# Patient Record
Sex: Male | Born: 1990 | Race: White | Hispanic: No | Smoking: Current every day smoker
Health system: Southern US, Community
[De-identification: ages and names within clinical notes are randomized; demographics above are authoritative.]

---
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
## 2012-09-15 ENCOUNTER — Emergency Department: Payer: Self-pay | Admitting: Emergency Medicine

## 2012-10-22 ENCOUNTER — Emergency Department: Payer: Self-pay | Admitting: Emergency Medicine

## 2014-05-06 ENCOUNTER — Emergency Department: Payer: Self-pay | Admitting: Emergency Medicine

## 2014-05-09 ENCOUNTER — Emergency Department: Payer: Self-pay | Admitting: Emergency Medicine

## 2014-05-10 ENCOUNTER — Emergency Department: Payer: Self-pay | Admitting: Emergency Medicine

## 2014-05-11 LAB — BASIC METABOLIC PANEL
Anion Gap: 5 — ABNORMAL LOW (ref 7–16)
BUN: 10 mg/dL (ref 7–18)
CALCIUM: 9.3 mg/dL (ref 8.5–10.1)
Chloride: 107 mmol/L (ref 98–107)
Co2: 30 mmol/L (ref 21–32)
Creatinine: 0.88 mg/dL (ref 0.60–1.30)
EGFR (African American): >60
EGFR (Non-African Amer.): >60
Glucose: 108 mg/dL — ABNORMAL HIGH (ref 65–99)
Osmolality: 283 (ref 275–301)
POTASSIUM: 3.3 mmol/L — AB (ref 3.5–5.1)
Sodium: 142 mmol/L (ref 136–145)

## 2014-05-11 LAB — CBC WITH DIFFERENTIAL/PLATELET
BASOS PCT: 0.6 %
Basophil #: 0.1 10*3/uL (ref 0.0–0.1)
EOS ABS: 0 10*3/uL (ref 0.0–0.7)
EOS PCT: 0.1 %
HCT: 46.4 % (ref 40.0–52.0)
HGB: 15.6 g/dL (ref 13.0–18.0)
LYMPHS PCT: 13.5 %
Lymphocyte #: 1.5 10*3/uL (ref 1.0–3.6)
MCH: 30.8 pg (ref 26.0–34.0)
MCHC: 33.5 g/dL (ref 32.0–36.0)
MCV: 92 fL (ref 80–100)
MONO ABS: 1.2 x10 3/mm — AB (ref 0.2–1.0)
Monocyte %: 10.5 %
NEUTROS ABS: 8.6 10*3/uL — AB (ref 1.4–6.5)
NEUTROS PCT: 75.3 %
Platelet: 164 10*3/uL (ref 150–440)
RBC: 5.06 10*6/uL (ref 4.40–5.90)
RDW: 13.3 % (ref 11.5–14.5)
WBC: 11.4 10*3/uL — ABNORMAL HIGH (ref 3.8–10.6)

## 2014-05-16 LAB — CULTURE, BLOOD (SINGLE)

## 2014-06-01 ENCOUNTER — Emergency Department: Payer: Self-pay | Admitting: Emergency Medicine

## 2019-07-05 ENCOUNTER — Other Ambulatory Visit: Payer: Self-pay

## 2019-07-05 ENCOUNTER — Emergency Department: Payer: Self-pay

## 2019-07-05 ENCOUNTER — Emergency Department
Admission: EM | Admit: 2019-07-05 | Discharge: 2019-07-06 | Disposition: A | Discharge status: Home / Self Care | Payer: Self-pay | Attending: Emergency Medicine | Admitting: Emergency Medicine

## 2019-07-05 DIAGNOSIS — R05 Cough: Secondary | ICD-10-CM | POA: Insufficient documentation

## 2019-07-05 DIAGNOSIS — R112 Nausea with vomiting, unspecified: Secondary | ICD-10-CM | POA: Insufficient documentation

## 2019-07-05 DIAGNOSIS — M7918 Myalgia, other site: Secondary | ICD-10-CM | POA: Insufficient documentation

## 2019-07-05 DIAGNOSIS — Z2883 Immunization not carried out due to unavailability of vaccine: Secondary | ICD-10-CM | POA: Insufficient documentation

## 2019-07-05 DIAGNOSIS — F17200 Nicotine dependence, unspecified, uncomplicated: Secondary | ICD-10-CM | POA: Insufficient documentation

## 2019-07-05 DIAGNOSIS — Z20828 Contact with and (suspected) exposure to other viral communicable diseases: Secondary | ICD-10-CM | POA: Insufficient documentation

## 2019-07-05 DIAGNOSIS — K529 Noninfective gastroenteritis and colitis, unspecified: Secondary | ICD-10-CM | POA: Insufficient documentation

## 2019-07-05 LAB — COMPREHENSIVE METABOLIC PANEL
ALT: 13 U/L (ref 0–44)
AST: 15 U/L (ref 15–41)
Albumin: 4 g/dL (ref 3.5–5.0)
Alkaline Phosphatase: 39 U/L (ref 38–126)
Anion gap: 9 (ref 5–15)
BUN: 11 mg/dL (ref 6–20)
CO2: 25 mmol/L (ref 22–32)
Calcium: 9 mg/dL (ref 8.9–10.3)
Chloride: 106 mmol/L (ref 98–111)
Creatinine, Ser: 0.93 mg/dL (ref 0.61–1.24)
GFR calc Af Amer: >60 mL/min (ref >60)
GFR calc non Af Amer: >60 mL/min (ref >60)
Glucose, Bld: 112 mg/dL — ABNORMAL HIGH (ref 70–99)
Potassium: 3.4 mmol/L — ABNORMAL LOW (ref 3.5–5.1)
Sodium: 140 mmol/L (ref 135–145)
Total Bilirubin: 0.8 mg/dL (ref 0.3–1.2)
Total Protein: 6.3 g/dL — ABNORMAL LOW (ref 6.5–8.1)

## 2019-07-05 LAB — CBC
HCT: 46.3 % (ref 39.0–52.0)
Hemoglobin: 16.5 g/dL (ref 13.0–17.0)
MCH: 32.3 pg (ref 26.0–34.0)
MCHC: 35.6 g/dL (ref 30.0–36.0)
MCV: 90.6 fL (ref 80.0–100.0)
Platelets: 199 10*3/uL (ref 150–400)
RBC: 5.11 MIL/uL (ref 4.22–5.81)
RDW: 11.9 % (ref 11.5–15.5)
WBC: 6.1 10*3/uL (ref 4.0–10.5)
nRBC: 0 % (ref 0.0–0.2)

## 2019-07-05 LAB — URINALYSIS, COMPLETE (UACMP) WITH MICROSCOPIC
Bacteria, UA: NONE SEEN
Bilirubin Urine: NEGATIVE
Glucose, UA: NEGATIVE mg/dL
Hgb urine dipstick: NEGATIVE
Ketones, ur: NEGATIVE mg/dL
Leukocytes,Ua: NEGATIVE
Nitrite: NEGATIVE
Protein, ur: NEGATIVE mg/dL
Specific Gravity, Urine: 1.02 (ref 1.005–1.030)
pH: 5 (ref 5.0–8.0)

## 2019-07-05 LAB — LIPASE, BLOOD: Lipase: 22 U/L (ref 11–51)

## 2019-07-05 MED ORDER — CIPROFLOXACIN HCL 500 MG PO TABS
500.0000 mg | ORAL_TABLET | Freq: Once | ORAL | Status: AC
  Administered 2019-07-05: 500 mg via ORAL
  Filled 2019-07-05: qty 1

## 2019-07-05 MED ORDER — METRONIDAZOLE 500 MG PO TABS: 500.0000 mg | ORAL_TABLET | Freq: Two times a day (BID) | ORAL | 0 refills | Status: DC

## 2019-07-05 MED ORDER — HYDROCODONE-ACETAMINOPHEN 5-325 MG PO TABS: 1.0000 | ORAL_TABLET | Freq: Four times a day (QID) | ORAL | 0 refills | Status: DC | PRN

## 2019-07-05 MED ORDER — CIPROFLOXACIN HCL 500 MG PO TABS: 500.0000 mg | ORAL_TABLET | Freq: Two times a day (BID) | ORAL | 0 refills | Status: AC

## 2019-07-05 MED ORDER — IOHEXOL 300 MG/ML  SOLN
100.0000 mL | Freq: Once | INTRAMUSCULAR | Status: AC | PRN
  Administered 2019-07-05: 100 mL via INTRAVENOUS

## 2019-07-05 MED ORDER — ONDANSETRON 4 MG PO TBDP
4.0000 mg | ORAL_TABLET | Freq: Once | ORAL | Status: AC
  Administered 2019-07-05: 4 mg via ORAL
  Filled 2019-07-05: qty 1

## 2019-07-05 MED ORDER — HYDROCODONE-ACETAMINOPHEN 5-325 MG PO TABS
2.0000 | ORAL_TABLET | Freq: Once | ORAL | Status: AC
  Administered 2019-07-05: 2 via ORAL
  Filled 2019-07-05: qty 2

## 2019-07-05 MED ORDER — MORPHINE SULFATE (PF) 4 MG/ML IV SOLN
4.0000 mg | Freq: Once | INTRAVENOUS | Status: AC
  Administered 2019-07-05: 20:00:00 4 mg via INTRAVENOUS
  Filled 2019-07-05: qty 1

## 2019-07-05 MED ORDER — ONDANSETRON HCL 4 MG/2ML IJ SOLN
4.0000 mg | Freq: Once | INTRAMUSCULAR | Status: AC
  Administered 2019-07-05: 4 mg via INTRAVENOUS
  Filled 2019-07-05: qty 2

## 2019-07-05 MED ORDER — ONDANSETRON 4 MG PO TBDP: 4.0000 mg | ORAL_TABLET | Freq: Four times a day (QID) | ORAL | 0 refills | Status: DC | PRN

## 2019-07-05 MED ORDER — METRONIDAZOLE 500 MG PO TABS
500.0000 mg | ORAL_TABLET | Freq: Once | ORAL | Status: AC
  Administered 2019-07-05: 500 mg via ORAL
  Filled 2019-07-05: qty 1

## 2019-07-05 MED ORDER — SODIUM CHLORIDE 0.9 % IV BOLUS
1000.0000 mL | Freq: Once | INTRAVENOUS | Status: AC
  Administered 2019-07-05: 1000 mL via INTRAVENOUS

## 2019-07-05 MED ORDER — IOHEXOL 240 MG/ML SOLN
50.0000 mL | Freq: Once | INTRAMUSCULAR | Status: AC
  Administered 2019-07-05: 50 mL via ORAL

## 2019-07-05 NOTE — ED Triage Notes (Signed)
Pt presents c/o abd pain x2 days with N/V/D. Reports no emesis since yesterday but pain has gotten worse.

## 2019-07-05 NOTE — ED Notes (Signed)
ED Provider at bedside. 

## 2019-07-05 NOTE — ED Provider Notes (Signed)
[facility] Emergency Department Provider Note ____________________________________________   First MD Initiated Contact with Patient 07/05/19 1924     (approximate)  I have reviewed the triage vital signs and the nursing notes.  HISTORY  Chief Complaint Abdominal Pain  HPI Danny Austin is a 28 y.o. male here for evaluation of abdominal pain  Patient reports that last couple days he had a few loose stool, vomited a couple times but has not vomited since noon yesterday.  Said mild nausea.  Decreased appetite.  Went to work today and they said he had a fever, they sent him home and advised him to seek medical care.  Reports his nausea and diarrhea is actually getting better but he is having left-sided and left upper abdominal pain.  He also reports he had a cough and some slight achiness for the last 2 weeks as well.  Dry cough no shortness of breath.  No headache.  No history of medical illness other than he had his tonsils taken out years ago   History reviewed. No pertinent past medical history.  There are no active problems to display for this patient.   History reviewed. No pertinent surgical history.  Prior to Admission medications   Medication Sig Start Date End Date Taking? Authorizing Provider  ciprofloxacin (CIPRO) 500 MG tablet Take 1 tablet (500 mg total) by mouth 2 (two) times daily for 5 days. 07/05/19 07/10/19  Sharyn CreamerQuale, Windi Toro, MD  HYDROcodone-acetaminophen (NORCO/VICODIN) 5-325 MG tablet Take 1-2 tablets by mouth every 6 (six) hours as needed for moderate pain. 07/05/19   Sharyn CreamerQuale, Omaree Fuqua, MD  metroNIDAZOLE (FLAGYL) 500 MG tablet Take 1 tablet (500 mg total) by mouth 2 (two) times daily. 07/05/19   Sharyn CreamerQuale, Hondo Nanda, MD  ondansetron (ZOFRAN ODT) 4 MG disintegrating tablet Take 1 tablet (4 mg total) by mouth every 6 (six) hours as needed for nausea or vomiting. 07/05/19   Sharyn CreamerQuale, Elisabet Gutzmer, MD    Allergies Penicillins  Patient reports allergic to penicillins  History  reviewed. No pertinent family history.  Social History Social History   Tobacco Use  . Smoking status: Current Every Day Smoker  . Smokeless tobacco: Never Used  Substance Use Topics  . Alcohol use: Not on file  . Drug use: Not on file  Does not drink.  Does smoke.  Review of Systems Constitutional: See HPI. Eyes: No visual changes. ENT: No sore throat. Cardiovascular: Denies chest pain. Respiratory: Denies shortness of breath. Gastrointestinal: See HPI Genitourinary: Negative for dysuria. Musculoskeletal: Negative for back pain. Skin: Negative for rash. Neurological: Negative for headaches, areas of focal weakness or numbness.    ____________________________________________   PHYSICAL EXAM:  VITAL SIGNS: ED Triage Vitals  Enc Vitals Group     BP 07/05/19 1545 (!) 148/78     Pulse Rate 07/05/19 1545 68     Resp 07/05/19 1545 18     Temp 07/05/19 1545 98.7 F (37.1 C)     Temp src --      SpO2 07/05/19 1545 97 %     Weight 07/05/19 1546 175 lb (79.4 kg)     Height 07/05/19 1546 5\' 10"  (1.778 m)     Head Circumference --      Peak Flow --      Pain Score 07/05/19 1553 0     Pain Loc --      Pain Edu? --      Excl. in GC? --     Constitutional: Alert and oriented.  Well appearing and in no acute distress. Eyes: Conjunctivae are normal. Head: Atraumatic. Nose: No congestion/rhinnorhea. Mouth/Throat: Mucous membranes are slightly dry. Neck: No stridor.  Cardiovascular: Normal rate, regular rhythm. Grossly normal heart sounds.  Good peripheral circulation. Respiratory: Normal respiratory effort.  No retractions. Lungs CTAB. Gastrointestinal: Soft and moderately tender across the left upper quadrant and left lower quadrant without rebound or guarding.  No right-sided abdominal pain. No distention. Musculoskeletal: No lower extremity tenderness nor edema. Neurologic:  Normal speech and language. No gross focal neurologic deficits are appreciated.  Skin:  Skin is  warm, dry and intact. No rash noted. Psychiatric: Mood and affect are normal. Speech and behavior are normal.  ____________________________________________   LABS (all labs ordered are listed, but only abnormal results are displayed)  Labs Reviewed  COMPREHENSIVE METABOLIC PANEL - Abnormal; Notable for the following components:      Result Value   Potassium 3.4 (*)    Glucose, Bld 112 (*)    Total Protein 6.3 (*)    All other components within normal limits  URINALYSIS, COMPLETE (UACMP) WITH MICROSCOPIC - Abnormal; Notable for the following components:   Color, Urine YELLOW (*)    APPearance CLEAR (*)    All other components within normal limits  SARS CORONAVIRUS 2  LIPASE, BLOOD  CBC   ____________________________________________  EKG   ____________________________________________  RADIOLOGY  Ct Abdomen Pelvis W Contrast  Result Date: 07/05/2019 CLINICAL DATA:  Nausea, vomiting, left-sided abdominal pain. Person under investigation for COVID-19. EXAM: CT ABDOMEN AND PELVIS WITH CONTRAST TECHNIQUE: Multidetector CT imaging of the abdomen and pelvis was performed using the standard protocol following bolus administration of intravenous contrast. CONTRAST:  100mL OMNIPAQUE IOHEXOL 300 MG/ML  SOLN COMPARISON:  None. FINDINGS: Lower chest: Lung bases are clear. No airspace disease. No pleural fluid. Hepatobiliary: No focal liver abnormality is seen. No gallstones, gallbladder wall thickening, or biliary dilatation. Pancreas: No ductal dilatation or inflammation. Spleen: Normal in size without focal abnormality. Adrenals/Urinary Tract: Normal adrenal glands. No hydronephrosis or perinephric edema. There is a 2.8 cm cystic structure in the upper right kidney with small dependent calcification. Urinary bladder is physiologically distended. No bladder wall thickening. Stomach/Bowel: Distal esophagus is decompressed. There is no gastric wall thickening. No bowel obstruction, administered  enteric contrast reaches the colon. Proximal small bowel are unremarkable. Short to moderate length segment of small bowel wall thickening with mild adjacent edema involving the distal and terminal ileum, series 2, images 48-62. The appendix is normal, series 2, image 54. Suggestion of mild wall thickening involving the cecum, ascending, and transverse colon. Vascular/Lymphatic: Abdominal aorta is normal in caliber. Small retroperitoneal nodes are not enlarged by size criteria. No mesenteric or pelvic adenopathy. Reproductive: Prostate is unremarkable. Other: Small volume of free fluid in the pelvis is likely reactive. No organized fluid collection. No free air. Musculoskeletal: There are no acute or suspicious osseous abnormalities. IMPRESSION: 1. Short to moderate length segment of small bowel wall inflammation involving the distal and terminal ileum, consistent with enteritis. This may be infectious or inflammatory, given terminal ileal involvement, Crohn's disease is considered. 2. Possible additional involvement of the cecum, ascending, and transverse colon 3. Right renal cyst with small dependent/peripheral calcification. This may represent a cyst with wall calcification versus a calyceal diverticulum containing a renal stone. Electronically Signed   By: Narda RutherfordMelanie  Sanford M.D.   On: 07/05/2019 22:00   Dg Chest Portable 1 View  Result Date: 07/05/2019 CLINICAL DATA:  Cough EXAM: PORTABLE CHEST 1  VIEW COMPARISON:  09/14/2008 FINDINGS: The heart size and mediastinal contours are within normal limits. Both lungs are clear. The visualized skeletal structures are unremarkable. IMPRESSION: No acute abnormality of the lungs in AP portable projection. Electronically Signed   By: Eddie Candle M.D.   On: 07/05/2019 20:34    CT imaging reviewed .  Enteritis with some evidence of mild colitis ____________________________________________   PROCEDURES  Procedure(s) performed: None  Procedures  Critical Care  performed: No  ____________________________________________   INITIAL IMPRESSION / ASSESSMENT AND PLAN / ED COURSE  Pertinent labs & imaging results that were available during my care of the patient were reviewed by me and considered in my medical decision making (see chart for details).   Differential diagnosis includes but is not limited to, abdominal perforation, aortic dissection, cholecystitis, appendicitis, diverticulitis, colitis, esophagitis/gastritis, kidney stone, pyelonephritis, urinary tract infection, aortic aneurysm. All are considered in decision and treatment plan. Based upon the patient's presentation and risk factors, will proceed with CT scan.  Also check COVID test  I am also concerned that his dry cough for the last 2 weeks with symptomatology could potentially represent viral illness, would also consider the potential that he has had coronavirus and is developing gastrointestinal symptoms associated.  We will proceed with CT to further evaluate for cause  Clinical Course as of Jul 04 2345  Sat Jul 05, 2019  2024 Discussed with patient, he will remain out of work pending his COVID test.  He also reports he is out of work a minimum 3 days per his employer   [MQ]    [place] [facility] [MQ] Delman Kitten, MD   ----------------------------------------- 11:46 PM on 07/05/2019 -----------------------------------------  Patient comfortable with plan for discharge.  He is resting comfortably, pain well controlled taking by mouth well.  Return precautions and treatment recommendations and follow-up discussed with the patient who is agreeable with the plan.  Unclear cause for his enteritis, will treat with antibiotic in the event bacterial.  Patient also pending COVID test.  ____________________________________________   FINAL CLINICAL IMPRESSION(S) / ED DIAGNOSES  Final diagnoses:  Enteritis  Covid-19 Virus Vaccine not Available        Note:  This  document was prepared using Dragon voice recognition software and may include unintentional dictation errors       Delman Kitten, MD 07/05/19 2346

## 2019-07-05 NOTE — ED Notes (Signed)
Reviewed discharge instructions, follow-up care, and prescriptions with patient. Patient verbalized understanding of all information reviewed. Patient stable, with no distress noted at this time.    

## 2019-07-05 NOTE — Discharge Instructions (Signed)
You were seen in the emergency room for abdominal pain. It is important that you follow up closely with your primary care doctor or gastroenterology in the next couple of days.   Please return to the emergency room right away if you are to develop a fever, severe nausea, your pain becomes severe or worsens, you are unable to keep food down, begin vomiting any dark or bloody fluid, you develop any dark or bloody stools, feel dehydrated, or other new concerns or symptoms arise.

## 2019-07-05 NOTE — ED Notes (Signed)
Saltines given for PO challenge per MD

## 2019-07-05 NOTE — ED Notes (Signed)
Patient able to tolerate saltine crackers with no issue

## 2019-07-06 LAB — SARS CORONAVIRUS 2 (TAT 6-24 HRS): SARS Coronavirus 2: NEGATIVE

## 2021-03-14 IMAGING — CT CT ABDOMEN AND PELVIS WITH CONTRAST
2 of 4 series · 15 of 46 positions shown, 17 images · IV contrast (omnipaque)
Comparison: None.

CLINICAL DATA: Nausea, vomiting, left-sided abdominal pain. Person
under investigation for [id number].

EXAM:
CT ABDOMEN AND PELVIS WITH CONTRAST
TECHNIQUE: Multidetector CT imaging of the abdomen and pelvis was performed
using the standard protocol following bolus administration of
intravenous contrast.
CONTRAST:  100mL OMNIPAQUE IOHEXOL 300 MG/ML  SOLN

[Series 2: routine abd/pel with · axial · 0.71mm/px · z∈[-546,-142]mm · 12 of 93 slices shown, 14 images]
[im 8/93  soft-tissue]
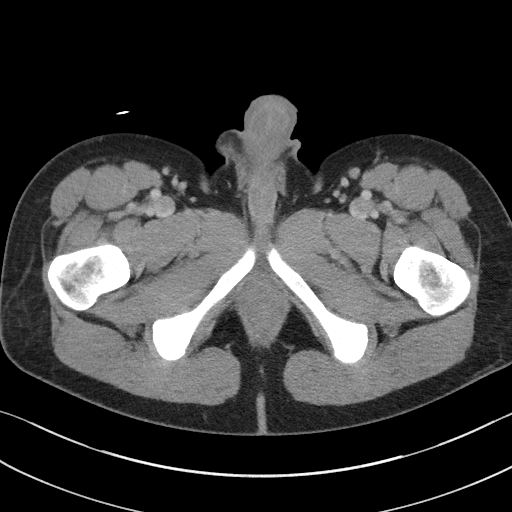
[im 8/93  bone]
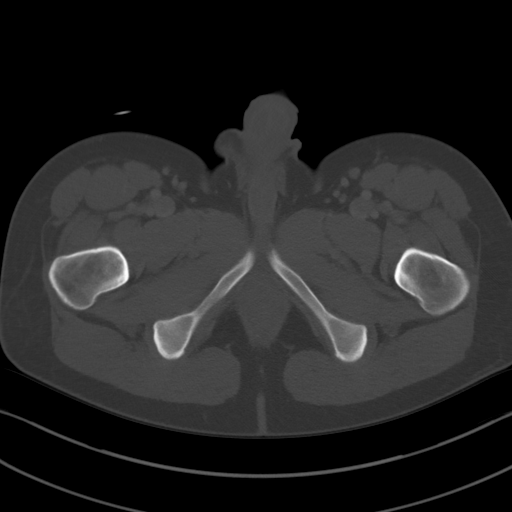
[im 15/93  soft-tissue]
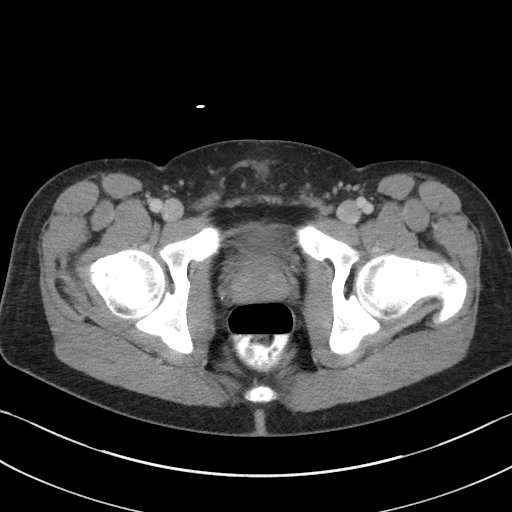
[im 23/93  soft-tissue]
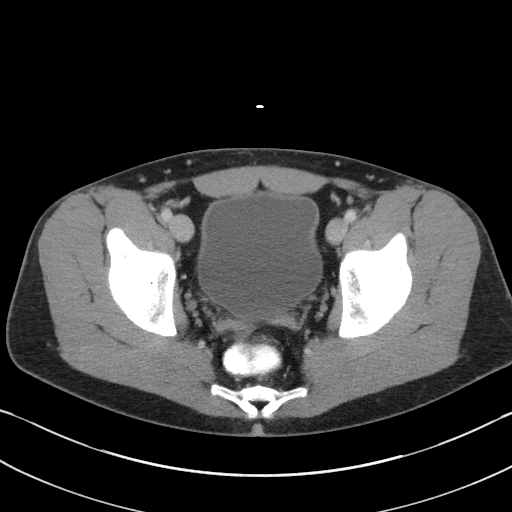
[im 30/93  soft-tissue]
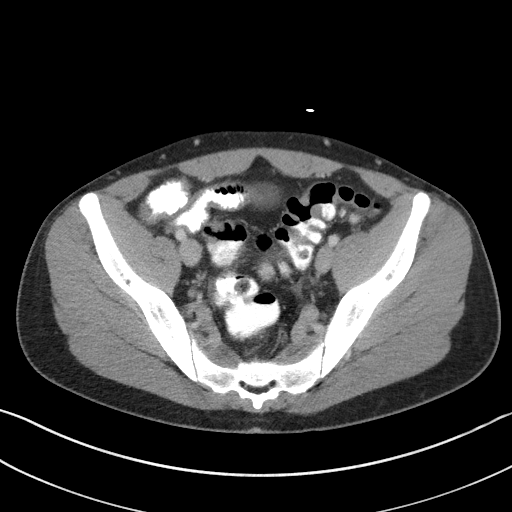
[im 37/93  soft-tissue]
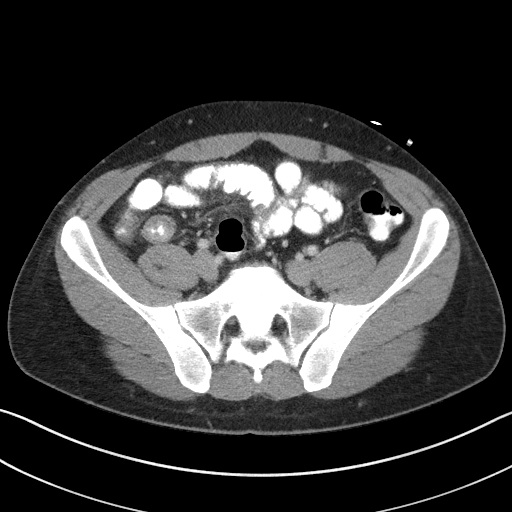
[im 45/93  soft-tissue]
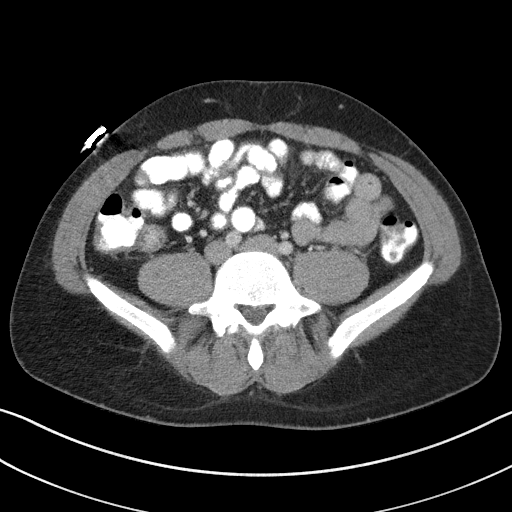
[im 52/93  soft-tissue]
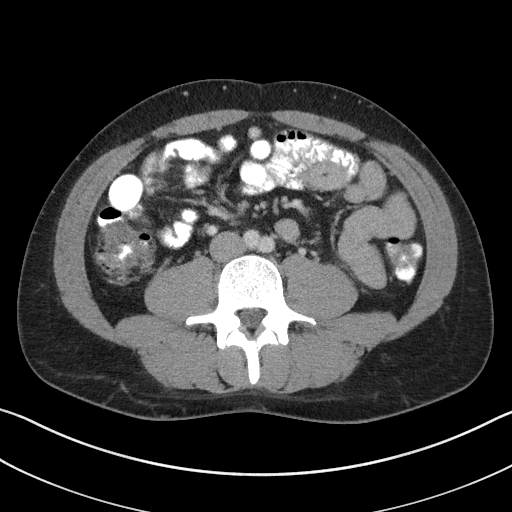
[im 59/93  soft-tissue]
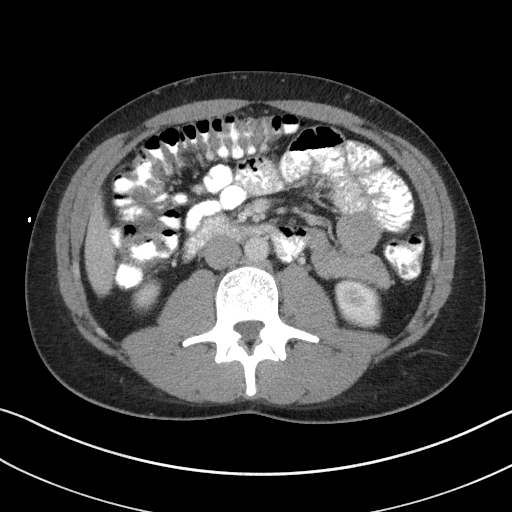
[im 67/93  soft-tissue]
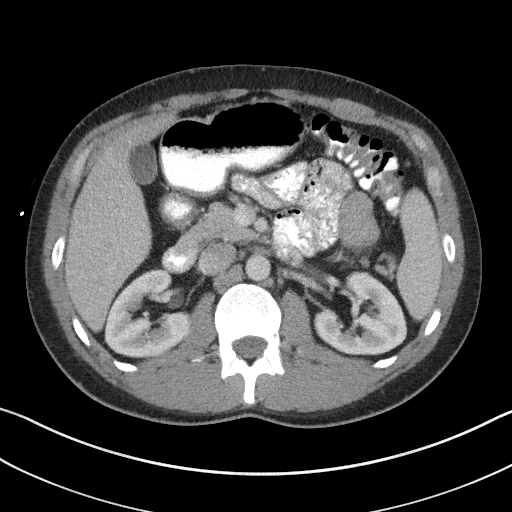
[im 67/93  bone]
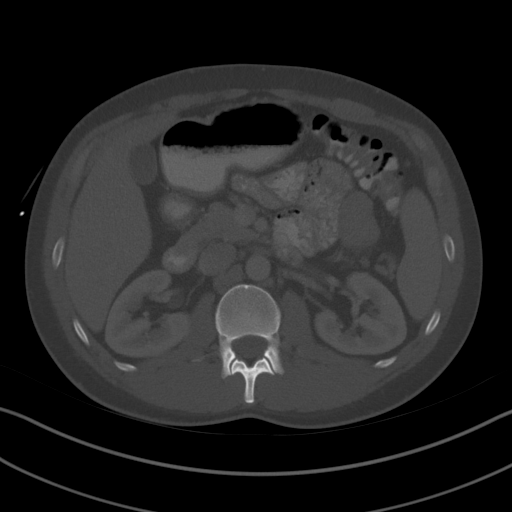
[im 74/93  soft-tissue]
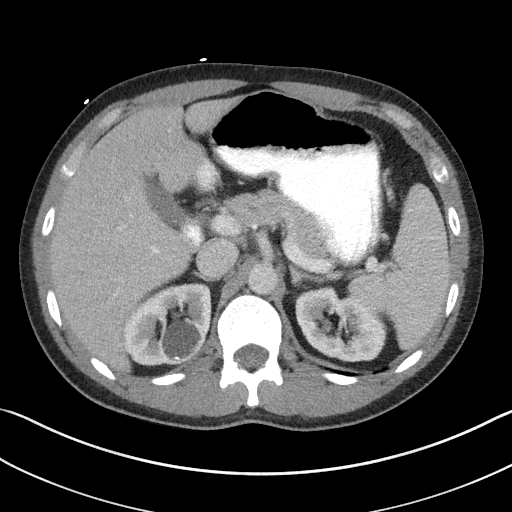
[im 81/93  soft-tissue]
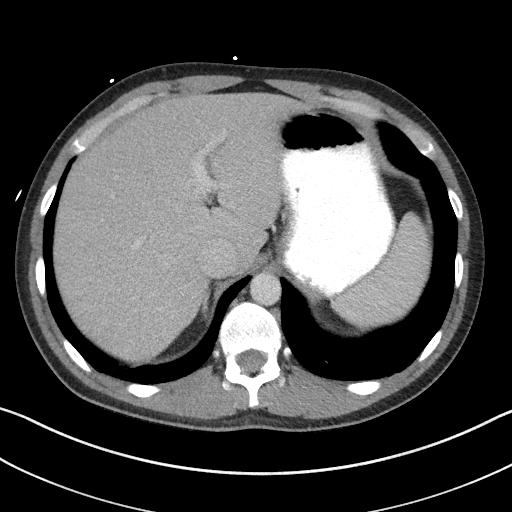
[im 89/93  soft-tissue]
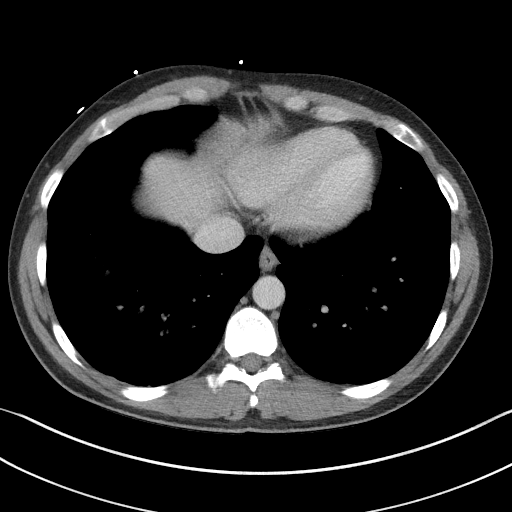

[Series 5: coronal st · coronal · 0.68mm/px · 3 of 85 slices shown]
[im 29/85  soft-tissue]
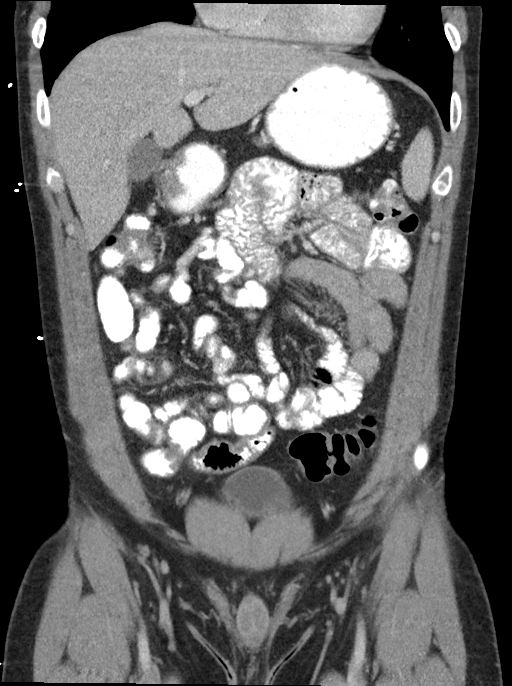
[im 38/85  soft-tissue]
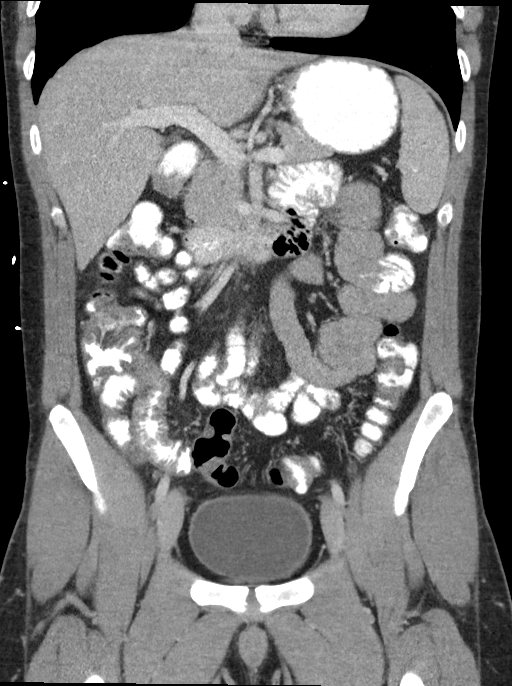
[im 47/85  soft-tissue]
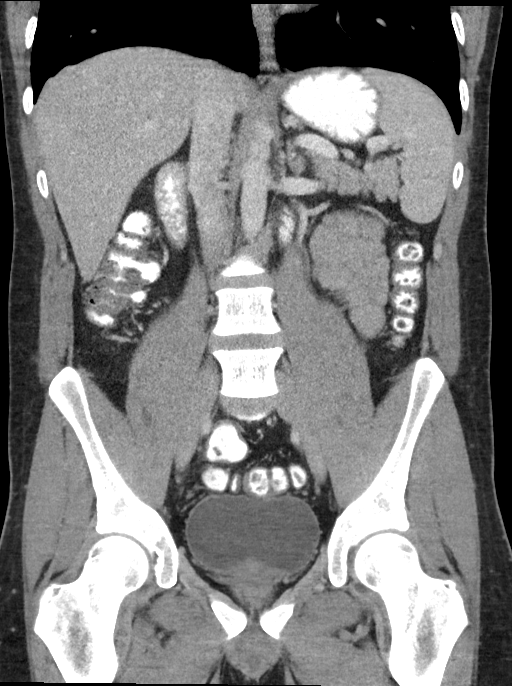

[15 of 46 positions shown; findings below may reference images not displayed]

FINDINGS: Lower chest: Lung bases are clear. No airspace disease. No pleural
fluid.

Hepatobiliary: No focal liver abnormality is seen. No gallstones,
gallbladder wall thickening, or biliary dilatation.

Pancreas: No ductal dilatation or inflammation.

Spleen: Normal in size without focal abnormality.

Adrenals/Urinary Tract: Normal adrenal glands. No hydronephrosis or
perinephric edema. There is a 2.8 cm cystic structure in the upper
right kidney with small dependent calcification. Urinary bladder is
physiologically distended. No bladder wall thickening.

Stomach/Bowel: Distal esophagus is decompressed. There is no gastric
wall thickening. No bowel obstruction, administered enteric contrast
reaches the colon. Proximal small bowel are unremarkable. Short to
moderate length segment of small bowel wall thickening with mild
adjacent edema involving the distal and terminal ileum, series 2,
images 48-62. The appendix is normal, series 2, image 54. Suggestion
of mild wall thickening involving the cecum, ascending, and
transverse colon.

Vascular/Lymphatic: Abdominal aorta is normal in caliber. Small
retroperitoneal nodes are not enlarged by size criteria. No
mesenteric or pelvic adenopathy.

Reproductive: Prostate is unremarkable.

Other: Small volume of free fluid in the pelvis is likely reactive.
No organized fluid collection. No free air.

Musculoskeletal: There are no acute or suspicious osseous
abnormalities.
IMPRESSION: 1. Short to moderate length segment of small bowel wall inflammation
involving the distal and terminal ileum, consistent with enteritis.
This may be infectious or inflammatory, given terminal ileal
involvement, Crohn's disease is considered.
2. Possible additional involvement of the cecum, ascending, and
transverse colon
3. Right renal cyst with small dependent/peripheral calcification.
This may represent a cyst with wall calcification versus a calyceal
diverticulum containing a renal stone.

## 2024-09-12 ENCOUNTER — Ambulatory Visit (HOSPITAL_COMMUNITY): Payer: Self-pay

## 2024-09-12 ENCOUNTER — Ambulatory Visit
Admission: EM | Admit: 2024-09-12 | Discharge: 2024-09-12 | Disposition: A | Discharge status: Home / Self Care | Payer: Self-pay | Attending: Emergency Medicine | Admitting: Emergency Medicine

## 2024-09-12 ENCOUNTER — Encounter: Payer: Self-pay | Admitting: Emergency Medicine

## 2024-09-12 ENCOUNTER — Ambulatory Visit (INDEPENDENT_AMBULATORY_CARE_PROVIDER_SITE_OTHER): Payer: Self-pay

## 2024-09-12 DIAGNOSIS — S2242XA Multiple fractures of ribs, left side, initial encounter for closed fracture: Secondary | ICD-10-CM

## 2024-09-12 DIAGNOSIS — R0789 Other chest pain: Secondary | ICD-10-CM

## 2024-09-12 MED ORDER — HYDROCODONE-ACETAMINOPHEN 5-325 MG PO TABS: 1.0000 | ORAL_TABLET | Freq: Four times a day (QID) | ORAL | 0 refills | Status: AC | PRN

## 2024-09-12 NOTE — ED Provider Notes (Addendum)
 [facility]    CSN: [id number] Arrival date & time: 09/12/24  0955      History   Chief Complaint Chief Complaint  Patient presents with   Fall   Rib Pain    HPI Danny Austin is a 33 y.o. male.   HPI  33 year old male with no significant past medical history presents for evaluation of left anterior chest wall pain.  He reports that 6 days ago he [place] [employer] and landed on his ribs.  Since then he has had pain with deep breathing, movement, and if he coughs or sneezes.  No hemoptysis.  History reviewed. No pertinent past medical history.  There are no active problems to display for this patient.   History reviewed. No pertinent surgical history.     Home Medications    Prior to Admission medications   Medication Sig Start Date End Date Taking? Authorizing Provider  HYDROcodone -acetaminophen  (NORCO/VICODIN) 5-325 MG tablet Take 1-2 tablets by mouth every 6 (six) hours as needed. Maximum of 6 tablets in a 24-hour period. 09/12/24  Yes Bernardino Ditch, NP    Family History History reviewed. No pertinent family history.  Social History Social History   Tobacco Use   Smoking status: Every Day   Smokeless tobacco: Never  Vaping Use   Vaping status: Never Used  Substance Use Topics   Drug use: Not Currently     Allergies   Penicillins   Review of Systems Review of Systems  Respiratory:  Positive for shortness of breath. Negative for cough.   Cardiovascular:  Positive for chest pain.     Physical Exam Triage Vital Signs ED Triage Vitals  Encounter Vitals Group     BP 09/12/24 1019 130/76     Girls Systolic BP Percentile --      Girls Diastolic BP Percentile --      Boys Systolic BP Percentile --      Boys Diastolic BP Percentile --      Pulse Rate 09/12/24 1019 (!) 50     Resp 09/12/24 1019 15     Temp 09/12/24 1019 97.8 F (36.6 C)     Temp Source 09/12/24 1019 Oral     SpO2 09/12/24 1019 96 %     Weight  09/12/24 1017 175 lb 0.7 oz (79.4 kg)     Height 09/12/24 1017 5' 10 (1.778 m)     Head Circumference --      Peak Flow --      Pain Score 09/12/24 1016 8     Pain Loc --      Pain Education --      Exclude from Growth Chart --    No data found.  Updated Vital Signs BP 130/76 (BP Location: Right Arm)   Pulse (!) 50   Temp 97.8 F (36.6 C) (Oral)   Resp 15   Ht 5' 10 (1.778 m)   Wt 175 lb 0.7 oz (79.4 kg)   SpO2 96%   BMI 25.12 kg/m   Visual Acuity Right Eye Distance:   Left Eye Distance:   Bilateral Distance:    Right Eye Near:   Left Eye Near:    Bilateral Near:     Physical Exam Vitals and nursing note reviewed.  Constitutional:      Appearance: Normal appearance. He is not ill-appearing.  HENT:     Head: Normocephalic and atraumatic.  Cardiovascular:     Rate and Rhythm: Normal rate  and regular rhythm.     Pulses: Normal pulses.     Heart sounds: Normal heart sounds. No murmur heard.    No friction rub. No gallop.  Pulmonary:     Effort: Pulmonary effort is normal.     Breath sounds: Normal breath sounds. No wheezing, rhonchi or rales.     Comments: Patient is tender over the anterior aspect of the 10th, 11th and 12th rib.  This extends laterally into the mid axillary line.  No crepitus.  No edema, ecchymosis, or erythema noted Chest:     Chest wall: Tenderness present.  Skin:    General: Skin is warm and dry.     Capillary Refill: Capillary refill takes less than 2 seconds.     Findings: No bruising or erythema.  Neurological:     General: No focal deficit present.     Mental Status: He is alert and oriented to person, place, and time.      UC Treatments / Results  Labs (all labs ordered are listed, but only abnormal results are displayed) Labs Reviewed - No data to display  EKG   Radiology DG Ribs Unilateral W/Chest Left Result Date: 09/12/2024 EXAM: AP VIEW XRAY OF THE LEFT RIBS AND CHEST 09/12/2024 10:35:00 AM COMPARISON: None available.  CLINICAL HISTORY: left rib pain due to fall a week ago FINDINGS: BONES: Acute nondisplaced anterolateral left seventh and eighth rib fractures. LUNGS AND PLEURA: No consolidation or pulmonary edema. No pleural effusion or pneumothorax. HEART AND MEDIASTINUM: No acute abnormality of the cardiac and mediastinal silhouettes. IMPRESSION: 1. Acute nondisplaced anterolateral left seventh and eighth rib fractures. Electronically signed by: Franky Stanford MD 09/12/2024 11:12 AM EDT RP Workstation: [id number]    Procedures Procedures (including critical care time)  Medications Ordered in UC Medications - No data to display  Initial Impression / Assessment and Plan / UC Course  I have reviewed the triage vital signs and the nursing notes.  Pertinent labs & imaging results that were available during my care of the patient were reviewed by me and considered in my medical decision making (see chart for details).   Patient is a pleasant 33 year old male who does appear to be in a moderate degree of pain here for evaluation of left anterior chest wall pain after suffering a ground-level fall 6 days ago [place] [employer].  He reports that he slipped on the ice.  He denies any bruising and I do not appreciate any bruising on exam, nor do I appreciate erythema or edema.  His lungs are clear to auscultation all fields.  He is able to speak in full sentence without dyspnea or tachypnea.  I asked the patient if he had been experiencing shortness of breath and he said yes but then he clarified that it is difficult for him to take a breath.  He does have tenderness with palpation of his lower ribs on the left-hand side without appreciable overlying ecchymosis, edema, or erythema.  I will obtain a left rib series to evaluate for any rib injury.  Left rib films independent reviewed and evaluated by me.  Impression: Lung fields are well aerated without evidence of pneumothorax, infiltrate, or effusion.  Cardiomediastinal  silhouette appears normal.  Area of pain is marked by a BB and I do not appreciate any bony injury to the adjacent ribs.  Radiology overread is pending. Radiology impression states acute, nondisplaced anterior lateral left 7th and 8th rib fractures.   Will discharge patient on the diagnosis  of anterior chest wall pain.  I will have him continue to use Tylenol  and/or ibuprofen as needed for mild to moderate pain as well as topical Salonpas or topical lidocaine patches.  I will order a short prescription of Norco that he can use at nighttime or as needed for more severe pain.  Patient has no open narcotic prescription per PDMP.  I called the patient and after identifying who I was speaking to through name and date of birth I advised him that the radiologist did not see 2 nondisplaced rib fractures.  No change in therapeutic course at this time.   Final Clinical Impressions(s) / UC Diagnoses   Final diagnoses:  Anterior chest wall pain  Closed fracture of multiple ribs of left side, initial encounter     Discharge Instructions      Your rib films did not show any evidence of broken ribs.  I do believe you have bruised your chest wall as a result of your recent fall.  You may apply ice to your chest wall for 20 at a time, 2-3 times a day, to help with pain and inflammation.  You may use over-the-counter Tylenol  and or ibuprofen according to the package instructions as needed for mild to moderate pain.  You may combine this with topical Salonpas or topical over-the-counter lidocaine patches.  Each patch is good for 8 hours.  You can apply directly to the area of pain.  For more severe pain you may take 1-2 Norco tablets every 6 hours as needed.  Be mindful these medications do contain Tylenol  so make sure you are not taking more than 3000 mg of Tylenol  in total in a 24-hour period.  Also, they do have the ability to sedate you so do not drink alcohol or drive while taking.  Also do not operate  any heavy machinery or sign any legal documents while you are taking the medication.  I encourage you to take 10 deep breaths every hour to help make sure that your lungs are well-inflated and prevent mucus trapping which could lead to pneumonia.  If you develop any new or worsening symptoms either return for reevaluation or follow-up with your primary care provider.     ED Prescriptions     Medication Sig Dispense Auth. Provider   HYDROcodone -acetaminophen  (NORCO/VICODIN) 5-325 MG tablet Take 1-2 tablets by mouth every 6 (six) hours as needed. Maximum of 6 tablets in a 24-hour period. 10 tablet Bernardino Ditch, NP      I have reviewed the PDMP during this encounter.   Bernardino Ditch, NP 09/12/24 1100    Bernardino Ditch, NP 09/12/24 [id number]

## 2024-09-12 NOTE — Discharge Instructions (Signed)
 Your rib films did not show any evidence of broken ribs.  I do believe you have bruised your chest wall as a result of your recent fall.  You may apply ice to your chest wall for 20 at a time, 2-3 times a day, to help with pain and inflammation.  You may use over-the-counter Tylenol  and or ibuprofen according to the package instructions as needed for mild to moderate pain.  You may combine this with topical Salonpas or topical over-the-counter lidocaine patches.  Each patch is good for 8 hours.  You can apply directly to the area of pain.  For more severe pain you may take 1-2 Norco tablets every 6 hours as needed.  Be mindful these medications do contain Tylenol  so make sure you are not taking more than 3000 mg of Tylenol  in total in a 24-hour period.  Also, they do have the ability to sedate you so do not drink alcohol or drive while taking.  Also do not operate any heavy machinery or sign any legal documents while you are taking the medication.  I encourage you to take 10 deep breaths every hour to help make sure that your lungs are well-inflated and prevent mucus trapping which could lead to pneumonia.  If you develop any new or worsening symptoms either return for reevaluation or follow-up with your primary care provider.

## 2024-09-12 NOTE — ED Triage Notes (Signed)
 Patient states that he slipped on ice and fell last Saturday a week ago.  Patient c/o left sided rib pain.
